# Patient Record
Sex: Male | Born: 1990 | Race: White | Hispanic: No | Marital: Single | Smoking: Former smoker
Health system: Southern US, Community
[De-identification: ages and names within clinical notes are randomized; demographics above are authoritative.]

## PROBLEM LIST (undated history)

## (undated) DIAGNOSIS — Z87442 Personal history of urinary calculi: Secondary | ICD-10-CM

## (undated) HISTORY — PX: KNEE ARTHROSCOPY: SUR90

## (undated) HISTORY — PX: EYE SURGERY: SHX253

---

## 2020-11-11 ENCOUNTER — Emergency Department
Admission: EM | Admit: 2020-11-11 | Discharge: 2020-11-11 | Disposition: A | Discharge status: Home / Self Care | Payer: Self-pay | Attending: Emergency Medicine | Admitting: Emergency Medicine

## 2020-11-11 ENCOUNTER — Encounter: Payer: Self-pay | Admitting: Emergency Medicine

## 2020-11-11 ENCOUNTER — Other Ambulatory Visit: Payer: Self-pay

## 2020-11-11 ENCOUNTER — Emergency Department: Payer: Self-pay

## 2020-11-11 DIAGNOSIS — Z87891 Personal history of nicotine dependence: Secondary | ICD-10-CM | POA: Insufficient documentation

## 2020-11-11 DIAGNOSIS — N201 Calculus of ureter: Secondary | ICD-10-CM | POA: Insufficient documentation

## 2020-11-11 HISTORY — DX: Personal history of urinary calculi: Z87.442

## 2020-11-11 LAB — URINALYSIS, ROUTINE W REFLEX MICROSCOPIC
Bilirubin Urine: NEGATIVE
Glucose, UA: NEGATIVE mg/dL
Ketones, ur: NEGATIVE mg/dL
Leukocytes,Ua: NEGATIVE
Nitrite: NEGATIVE
Protein, ur: NEGATIVE mg/dL
Specific Gravity, Urine: 1.019 (ref 1.005–1.030)
Squamous Epithelial / HPF: NONE SEEN (ref 0–5)
pH: 5 (ref 5.0–8.0)

## 2020-11-11 LAB — CBC WITH DIFFERENTIAL/PLATELET
Abs Immature Granulocytes: 0.04 10*3/uL (ref 0.00–0.07)
Basophils Absolute: 0 10*3/uL (ref 0.0–0.1)
Basophils Relative: 0 %
Eosinophils Absolute: 0.1 10*3/uL (ref 0.0–0.5)
Eosinophils Relative: 1 %
HCT: 44.4 % (ref 39.0–52.0)
Hemoglobin: 15.7 g/dL (ref 13.0–17.0)
Immature Granulocytes: 1 %
Lymphocytes Relative: 45 %
Lymphs Abs: 3.8 10*3/uL (ref 0.7–4.0)
MCH: 29 pg (ref 26.0–34.0)
MCHC: 35.4 g/dL (ref 30.0–36.0)
MCV: 81.9 fL (ref 80.0–100.0)
Monocytes Absolute: 0.6 10*3/uL (ref 0.1–1.0)
Monocytes Relative: 7 %
Neutro Abs: 3.9 10*3/uL (ref 1.7–7.7)
Neutrophils Relative %: 46 %
Platelets: 231 10*3/uL (ref 150–400)
RBC: 5.42 MIL/uL (ref 4.22–5.81)
RDW: 12.8 % (ref 11.5–15.5)
WBC: 8.5 10*3/uL (ref 4.0–10.5)
nRBC: 0 % (ref 0.0–0.2)

## 2020-11-11 LAB — COMPREHENSIVE METABOLIC PANEL
ALT: 34 U/L (ref 0–44)
AST: 22 U/L (ref 15–41)
Albumin: 4.1 g/dL (ref 3.5–5.0)
Alkaline Phosphatase: 81 U/L (ref 38–126)
Anion gap: 10 (ref 5–15)
BUN: 16 mg/dL (ref 6–20)
CO2: 26 mmol/L (ref 22–32)
Calcium: 8.9 mg/dL (ref 8.9–10.3)
Chloride: 106 mmol/L (ref 98–111)
Creatinine, Ser: 1.1 mg/dL (ref 0.61–1.24)
GFR, Estimated: >60 mL/min (ref >60)
Glucose, Bld: 144 mg/dL — ABNORMAL HIGH (ref 70–99)
Potassium: 3.5 mmol/L (ref 3.5–5.1)
Sodium: 142 mmol/L (ref 135–145)
Total Bilirubin: 0.8 mg/dL (ref 0.3–1.2)
Total Protein: 7.4 g/dL (ref 6.5–8.1)

## 2020-11-11 MED ORDER — TAMSULOSIN HCL 0.4 MG PO CAPS: ORAL_CAPSULE | ORAL | 0 refills | Status: AC

## 2020-11-11 MED ORDER — OXYCODONE-ACETAMINOPHEN 5-325 MG PO TABS: 2.0000 | ORAL_TABLET | Freq: Four times a day (QID) | ORAL | 0 refills | Status: DC | PRN

## 2020-11-11 MED ORDER — DOCUSATE SODIUM 100 MG PO CAPS: ORAL_CAPSULE | ORAL | 0 refills | Status: AC

## 2020-11-11 MED ORDER — ONDANSETRON HCL 4 MG/2ML IJ SOLN
4.0000 mg | INTRAMUSCULAR | Status: AC
  Administered 2020-11-11: 4 mg via INTRAVENOUS
  Filled 2020-11-11: qty 2

## 2020-11-11 MED ORDER — MORPHINE SULFATE (PF) 4 MG/ML IV SOLN
4.0000 mg | Freq: Once | INTRAVENOUS | Status: AC
  Administered 2020-11-11: 4 mg via INTRAVENOUS
  Filled 2020-11-11: qty 1

## 2020-11-11 MED ORDER — KETOROLAC TROMETHAMINE 30 MG/ML IJ SOLN
15.0000 mg | Freq: Once | INTRAMUSCULAR | Status: AC
  Administered 2020-11-11: 15 mg via INTRAVENOUS
  Filled 2020-11-11: qty 1

## 2020-11-11 MED ORDER — ONDANSETRON 4 MG PO TBDP: ORAL_TABLET | ORAL | 0 refills | Status: AC

## 2020-11-11 NOTE — Discharge Instructions (Signed)
You have been seen in the Emergency Department (ED) today for pain caused by kidney stones.  As we have discussed, please drink plenty of fluids.  Please make a follow up appointment with the physician(s) listed elsewhere in this documentation.  You may take pain medication as needed but ONLY as prescribed.  Please also take your prescribed Flomax daily.  We also recommend that you take over-the-counter ibuprofen regularly according to label instructions over the next 5 days.  Take it with meals to minimize stomach discomfort.  Please see your doctor as soon as possible as stones may take 1-3 weeks to pass and you may require additional care or medications.  Do not drink alcohol, drive or participate in any other potentially dangerous activities while taking opiate pain medication as it may make you sleepy. Do not take this medication with any other sedating medications, either prescription or over-the-counter. If you were prescribed Percocet or Vicodin, do not take these with acetaminophen (Tylenol) as it is already contained within these medications.   Take Percocet as needed for severe pain.  This medication is an opiate (or narcotic) pain medication and can be habit forming.  Use it as little as possible to achieve adequate pain control.  Do not use or use it with extreme caution if you have a history of opiate abuse or dependence.  If you are on a pain contract with your primary care doctor or a pain specialist, be sure to let them know you were prescribed this medication today from the [facility].  This medication is intended for your use only - do not give any to anyone else and keep it in a secure place where nobody else, especially children, have access to it.  It will also cause or worsen constipation, so you may want to consider taking an over-the-counter stool softener while you are taking this medication.  Return to the Emergency Department (ED) or call your doctor  if you have any worsening pain, fever, painful urination, are unable to urinate, or develop other symptoms that concern you. 

## 2020-11-11 NOTE — ED Triage Notes (Signed)
Patient ambulatory to triage with steady gait, without difficulty, appears uncomfortable; reports left flank pain radiating into left lower abd for few days, awoke PTA with increased pain; denies accomp symptoms; st hx kidney stones

## 2020-11-11 NOTE — ED Notes (Signed)
Pt to CT

## 2020-11-11 NOTE — ED Provider Notes (Signed)
[facility] Emergency Department Provider Note  ____________________________________________   Event Date/Time   First MD Initiated Contact with Patient 11/11/20 0103     (approximate)  I have reviewed the triage vital signs and the nursing notes.   HISTORY  Chief Complaint Flank Pain    HPI Marcus Bridges is a 30 y.o. male with no significant chronic medical issues but who has a history of a prior kidney stone and presents tonight for acute onset and severe pain in his left flank and left lower abdomen that radiates to the back.  It started about a week ago and was relatively mild.  He felt an occasional sharp pain but it was not bad.  A couple hours ago he felt it before he went to bed and then it woke him up from sleep with sharp and severe pain with a constant ache.  He had nausea but no vomiting.  He cannot find a position of comfort and nothing in particular makes his symptoms better or worse.  He denies any pain when he urinates and denies blood in the urine.  He denies fever, sore throat, chest pain, shortness of breath, and any other abdominal pain.  No recent injuries or trauma.         Past Medical History:  Diagnosis Date  . History of kidney stones     There are no problems to display for this patient.   Past Surgical History:  Procedure Laterality Date  . EYE SURGERY    . KNEE ARTHROSCOPY Left     Prior to Admission medications   Medication Sig Start Date End Date Taking? Authorizing Provider  docusate sodium (COLACE) 100 MG capsule Take 1 tablet once or twice daily as needed for constipation while taking narcotic pain medicine 11/11/20  Yes Loleta Rose, MD  ondansetron (ZOFRAN ODT) 4 MG disintegrating tablet Allow 1-2 tablets to dissolve in your mouth every 8 hours as needed for nausea/vomiting 11/11/20  Yes Loleta Rose, MD  oxyCODONE-acetaminophen (PERCOCET) 5-325 MG tablet Take 2 tablets by mouth every 6 (six) hours as needed for  severe pain. 11/11/20  Yes Loleta Rose, MD  tamsulosin (FLOMAX) 0.4 MG CAPS capsule Take 1 tablet by mouth daily until you pass the kidney stone or no longer have symptoms 11/11/20  Yes Loleta Rose, MD    Allergies Patient has no known allergies.  History reviewed. No pertinent family history.  Social History Social History   Tobacco Use  . Smoking status: Former Games developer  . Smokeless tobacco: Never Used  Vaping Use  . Vaping Use: Some days    Review of Systems Constitutional: No fever/chills Eyes: No visual changes. ENT: No sore throat. Cardiovascular: Denies chest pain. Respiratory: Denies shortness of breath. Gastrointestinal: Pain in the left lower quadrant radiating to the back with nausea but no vomiting. Genitourinary: Negative for dysuria. Musculoskeletal: Pain in the left flank. Integumentary: Negative for rash. Neurological: Negative for headaches, focal weakness or numbness.   ____________________________________________   PHYSICAL EXAM:  VITAL SIGNS: ED Triage Vitals  Enc Vitals Group     BP 11/11/20 0026 (!) 138/103     Pulse Rate 11/11/20 0026 77     Resp 11/11/20 0026 18     Temp 11/11/20 0026 98.4 F (36.9 C)     Temp Source 11/11/20 0026 Oral     SpO2 11/11/20 0026 97 %     Weight 11/11/20 0027 99.8 kg (220 lb)     Height 11/11/20 0027  1.803 m (5\' 11" )     Head Circumference --      Peak Flow --      Pain Score 11/11/20 0026 7     Pain Loc --      Pain Edu? --      Excl. in GC? --     Constitutional: Alert and oriented.  Appears quite uncomfortable. Eyes: Conjunctivae are normal.  Head: Atraumatic. Nose: No congestion/rhinnorhea. Mouth/Throat: Patient is wearing a mask. Neck: No stridor.  No meningeal signs.   Cardiovascular: Normal rate, regular rhythm. Good peripheral circulation. Respiratory: Normal respiratory effort.  No retractions. Gastrointestinal: Soft and nondistended.  Mild tenderness to palpation of the left lower  quadrant. Musculoskeletal: Left CVA tenderness to percussion.  No lower extremity tenderness nor edema. No gross deformities of extremities. Neurologic:  Normal speech and language. No gross focal neurologic deficits are appreciated.  Skin:  Skin is warm, dry and intact. Psychiatric: Mood and affect are normal. Speech and behavior are normal.  ____________________________________________   LABS (all labs ordered are listed, but only abnormal results are displayed)  Labs Reviewed  COMPREHENSIVE METABOLIC PANEL - Abnormal; Notable for the following components:      Result Value   Glucose, Bld 144 (*)    All other components within normal limits  URINALYSIS, ROUTINE W REFLEX MICROSCOPIC - Abnormal; Notable for the following components:   Color, Urine YELLOW (*)    APPearance CLEAR (*)    Hgb urine dipstick MODERATE (*)    Bacteria, UA RARE (*)    All other components within normal limits  URINE CULTURE  CBC WITH DIFFERENTIAL/PLATELET   ____________________________________________  EKG  No indication for emergent EKG ____________________________________________  RADIOLOGY I, 01/09/21, personally viewed and evaluated these images (plain radiographs) as part of my medical decision making, as well as reviewing the written report by the radiologist.  ED MD interpretation: 4 mm left mid ureteral stone.  Official radiology report(s): CT Renal Stone Study  Result Date: 11/11/2020 CLINICAL DATA:  Left flank pain with nausea, history of urolithiasis EXAM: CT ABDOMEN AND PELVIS WITHOUT CONTRAST TECHNIQUE: Multidetector CT imaging of the abdomen and pelvis was performed following the standard protocol without IV contrast. COMPARISON:  None. FINDINGS: Lower chest: Ground-glass opacity in dependent left lung base favored to be atelectatic. Lung bases otherwise clear. Normal heart size. No pericardial effusion. Hepatobiliary: No visible focal liver lesion with limitations of this unenhanced  CT. Diffuse hepatic hypoattenuation compatible with hepatic steatosis. Sparing seen along the gallbladder fossa. Gallbladder largely decompressed. No pericholecystic fluid or inflammation. No visible calcified gallstones. No biliary ductal dilatation. Pancreas: Partial fatty replacement of the pancreas. No pancreatic ductal dilatation or surrounding inflammatory changes. Spleen: Normal in size. No concerning splenic lesions. Adrenals/Urinary Tract: Normal adrenal glands. Kidneys are symmetric in size and normally located. There is asymmetric mild left hydroureteronephrosis to the level of a 4 mm calculus in the mid left ureter as it crosses the psoas musculature (5/75, 2/56). No other urolithiasis or right urinary tract dilatation. Urinary bladder is unremarkable for degree of distension. Stomach/Bowel: Distal esophagus, stomach and duodenal sweep are unremarkable. No small bowel wall thickening or dilatation. No evidence of obstruction. A normal appendix is visualized. No colonic dilatation or wall thickening. Few noninflamed distal colonic diverticula. Vascular/Lymphatic: No significant vascular findings are present. No enlarged abdominal or pelvic lymph nodes. Reproductive: The prostate and seminal vesicles are unremarkable. Other: No abdominopelvic free fluid or free gas. No bowel containing hernias. Musculoskeletal: No acute  osseous abnormality or suspicious osseous lesion. IMPRESSION: 1. Mild left hydroureteronephrosis to the level of a 4 mm calculus in the mid left ureter as it crosses the psoas musculature. 2. Hepatic steatosis. Electronically Signed   By: Kreg Shropshire M.D.   On: 11/11/2020 01:26    ____________________________________________   PROCEDURES   Procedure(s) performed (including Critical Care):  Procedures   ____________________________________________   INITIAL IMPRESSION / MDM / ASSESSMENT AND PLAN / ED COURSE  As part of my medical decision making, I reviewed the following  data within the electronic MEDICAL RECORD NUMBER Nursing notes reviewed and incorporated, Labs reviewed , Old chart reviewed, Notes from prior ED visits and [place] Controlled Substance Database   Differential diagnosis includes, but is not limited to, renal/ureteral colic, UTI/pyelonephritis, STD, diverticulitis or other acute intra-abdominal infection, renal ischemia.  Symptoms strongly suggested renal/ureteral colic and I ordered a CT renal stone protocol which demonstrated a 4 mm left mid ureteral stone.  Vital signs are essentially normal other than some hypertension which is likely contributed to by his pain.  He is afebrile.  CMP and CBC are both normal.  Doubt infection, but will check urinalysis.  Treating with morphine 4 mg IV, Toradol 15 mg IV, and Zofran 4 mg IV.  I talked with him about the possibility of lithotripsy later today even though I think it might be too late for him to get on the schedule, and he would rather try to pass it on his own since it is of a size that should pass by itself.  I will give him follow-up information with Dr. Apolinar Junes in case he is still symptomatic next week.       Clinical Course as of 11/11/20 0253  Thu Nov 11, 2020  0230 Urinalysis, Routine w reflex microscopic Urine, Clean Catch(!) Generally reassuring urinalysis with stone hemoglobinuria but no clear evidence of infection.  He does have rare bacteria present so we will send a urine culture but I strongly doubt active infection. [CF]  0240 Patient feeling much better.  Ready to go home.  Reiterated plan, follow up recommendations, and return precautions. [CF]    Clinical Course User Index [CF] Loleta Rose, MD     ____________________________________________  FINAL CLINICAL IMPRESSION(S) / ED DIAGNOSES  Final diagnoses:  Left ureteral stone     MEDICATIONS GIVEN DURING THIS VISIT:  Medications  morphine 4 MG/ML injection 4 mg (4 mg Intravenous Given 11/11/20 0146)  ketorolac (TORADOL) 30 MG/ML  injection 15 mg (15 mg Intravenous Given 11/11/20 0145)  ondansetron (ZOFRAN) injection 4 mg (4 mg Intravenous Given 11/11/20 0146)     ED Discharge Orders         Ordered    oxyCODONE-acetaminophen (PERCOCET) 5-325 MG tablet  Every 6 hours PRN        11/11/20 0253    ondansetron (ZOFRAN ODT) 4 MG disintegrating tablet        11/11/20 0253    tamsulosin (FLOMAX) 0.4 MG CAPS capsule        11/11/20 0253    docusate sodium (COLACE) 100 MG capsule        11/11/20 0253          *Please note:  Marcus Bridges was evaluated in Emergency Department on 11/11/2020 for the symptoms described in the history of present illness. He was evaluated in the context of the global COVID-19 pandemic, which necessitated consideration that the patient might be at risk for infection with the SARS-CoV-2 virus that causes  COVID-19. Institutional protocols and algorithms that pertain to the evaluation of patients at risk for COVID-19 are in a state of rapid change based on information released by regulatory bodies including the CDC and federal and state organizations. These policies and algorithms were followed during the patient's care in the ED.  Some ED evaluations and interventions may be delayed as a result of limited staffing during and after the pandemic.*  Note:  This document was prepared using Dragon voice recognition software and may include unintentional dictation errors.   Loleta Rose, MD 11/11/20 [phone]

## 2020-11-12 LAB — URINE CULTURE
Culture: <10000 — AB
Special Requests: NORMAL

## 2022-02-12 IMAGING — CT CT RENAL STONE PROTOCOL
2 of 4 series · 16 of 46 positions shown, 18 images · non-contrast
Comparison: None.

CLINICAL DATA: Left flank pain with nausea, history of urolithiasis

EXAM:
CT ABDOMEN AND PELVIS WITHOUT CONTRAST
TECHNIQUE: Multidetector CT imaging of the abdomen and pelvis was performed
following the standard protocol without IV contrast.

[Series 2: stone full standard · axial · 0.88mm/px · z∈[-269,+221]mm · 13 of 110 slices shown, 15 images]
[im 6/110  soft-tissue]
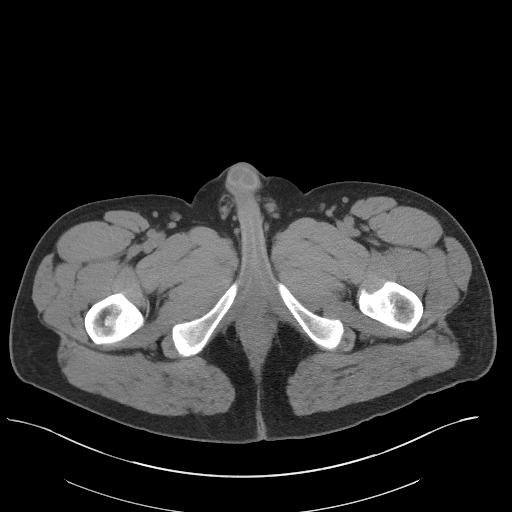
[im 6/110  bone]
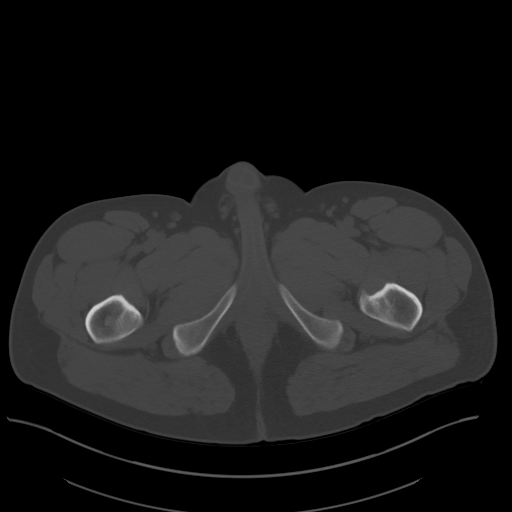
[im 16/110  soft-tissue]
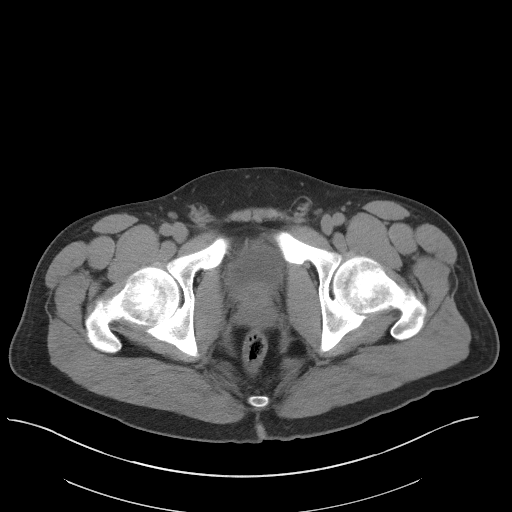
[im 21/110  soft-tissue]
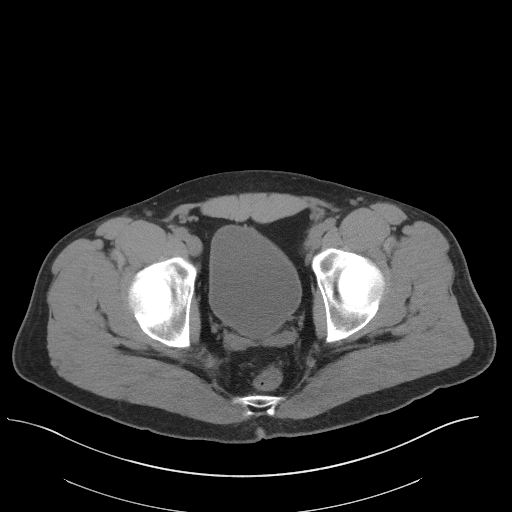
[im 32/110  soft-tissue]
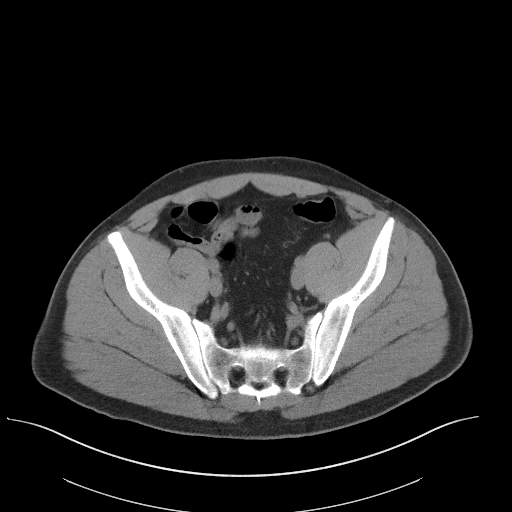
[im 37/110  soft-tissue]
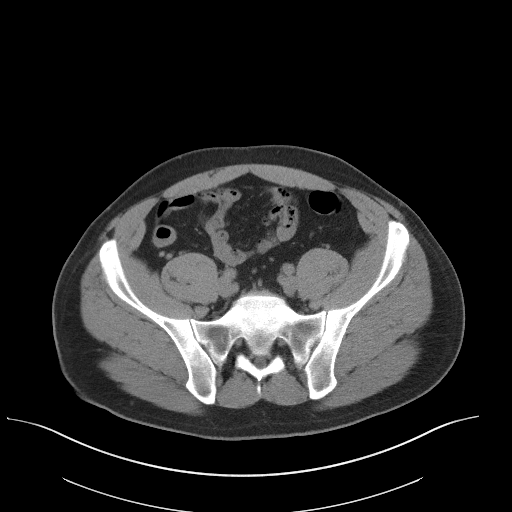
[im 47/110  soft-tissue]
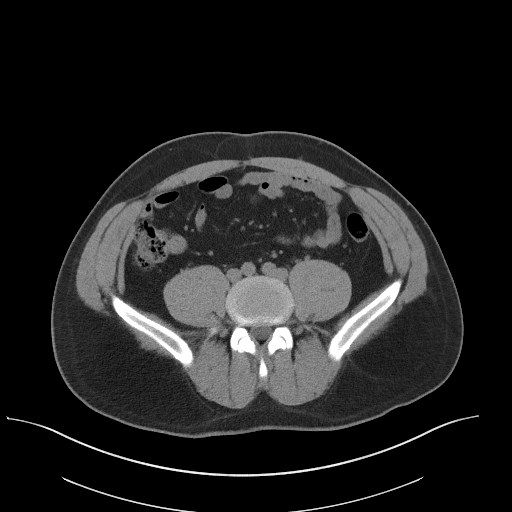
[im 58/110  soft-tissue]
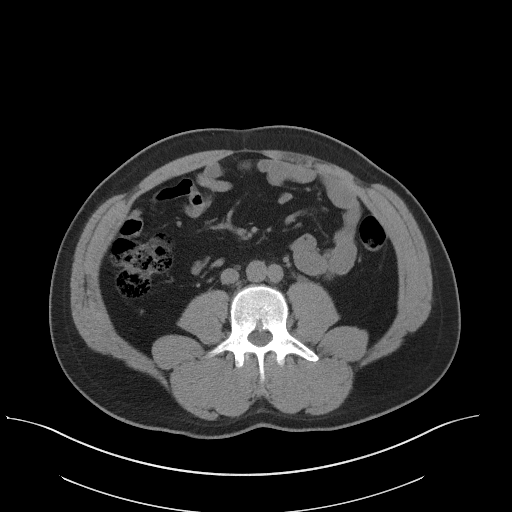
[im 63/110  soft-tissue]
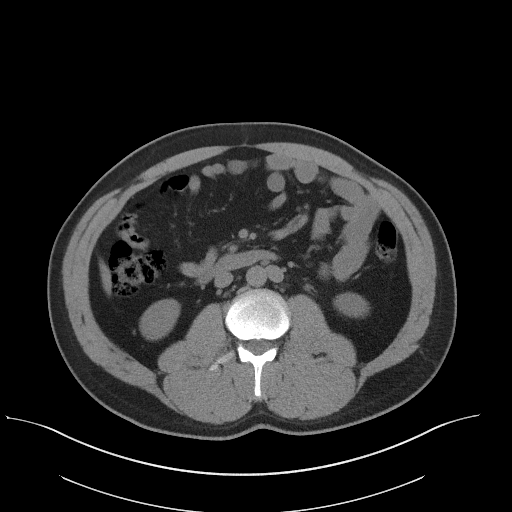
[im 73/110  soft-tissue]
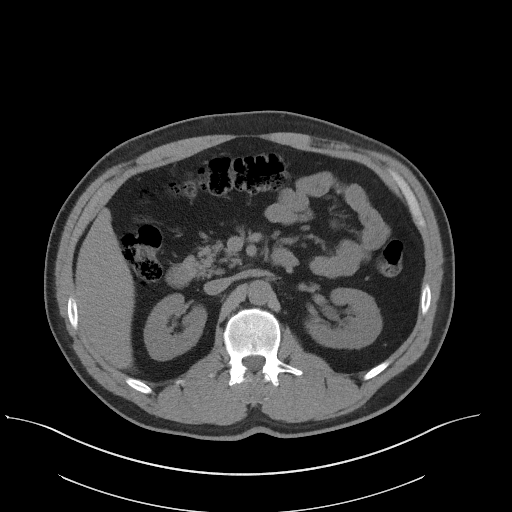
[im 73/110  bone]
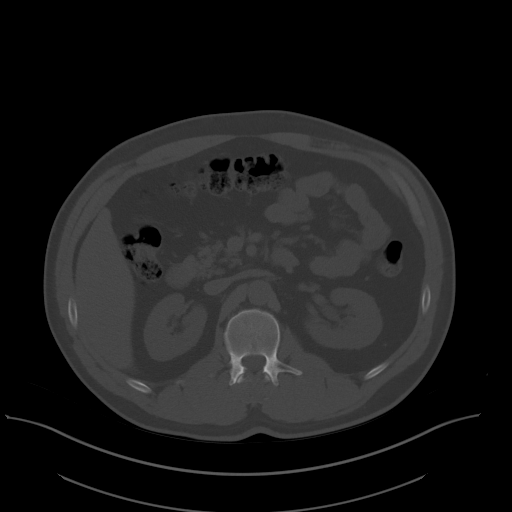
[im 78/110  soft-tissue]
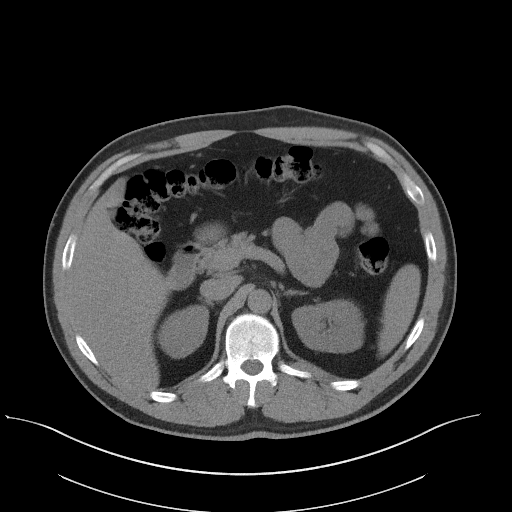
[im 89/110  soft-tissue]
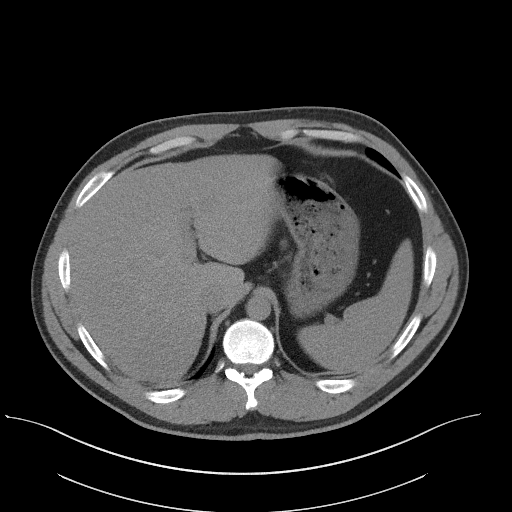
[im 94/110  soft-tissue]
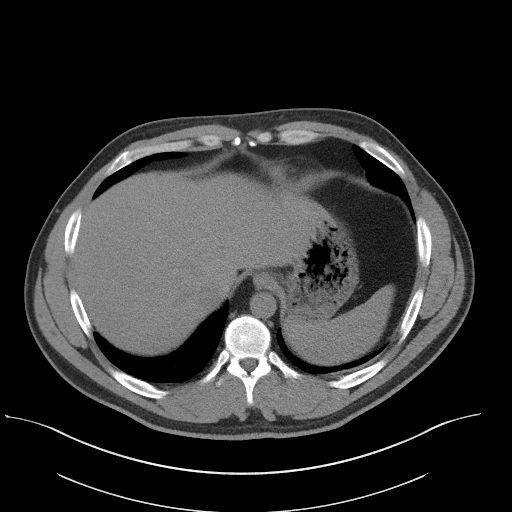
[im 104/110  soft-tissue]
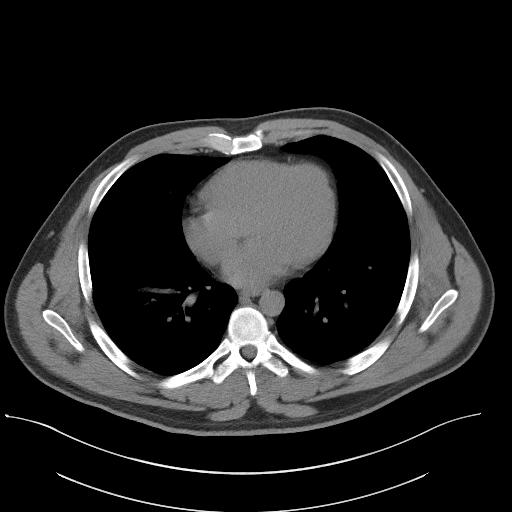

[Series 5: coronal · coronal · 0.87mm/px · 3 of 149 slices shown]
[im 50/149  soft-tissue]
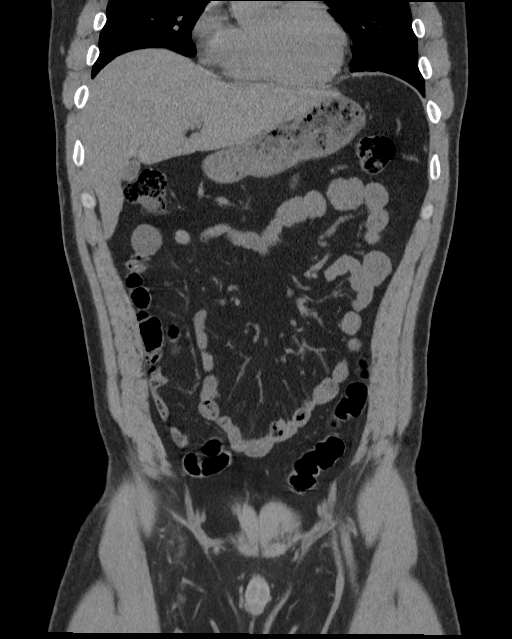
[im 66/149  soft-tissue]
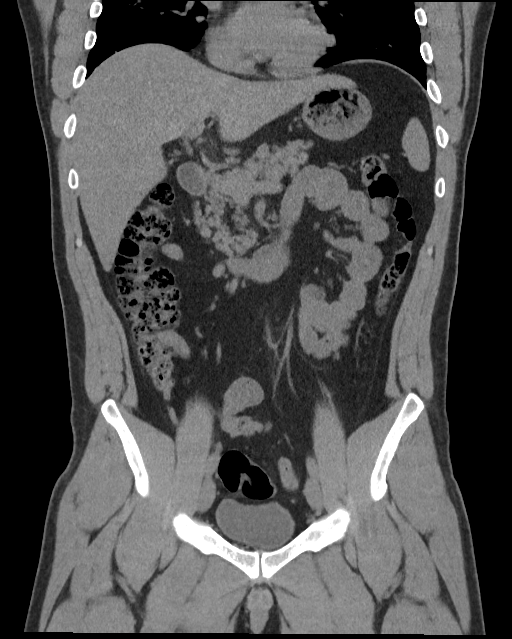
[im 83/149  soft-tissue]
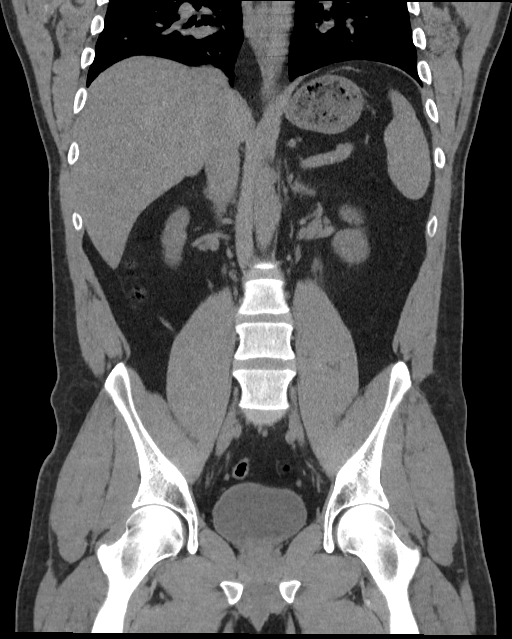

[16 of 46 positions shown; findings below may reference images not displayed]

FINDINGS: Lower chest: Ground-glass opacity in dependent left lung base
favored to be atelectatic. Lung bases otherwise clear. Normal heart
size. No pericardial effusion.

Hepatobiliary: No visible focal liver lesion with limitations of
this unenhanced CT. Diffuse hepatic hypoattenuation compatible with
hepatic steatosis. Sparing seen along the gallbladder fossa.
Gallbladder largely decompressed. No pericholecystic fluid or
inflammation. No visible calcified gallstones. No biliary ductal
dilatation.

Pancreas: Partial fatty replacement of the pancreas. No pancreatic
ductal dilatation or surrounding inflammatory changes.

Spleen: Normal in size. No concerning splenic lesions.

Adrenals/Urinary Tract: Normal adrenal glands. Kidneys are symmetric
in size and normally located. There is asymmetric mild left
hydroureteronephrosis to the level of a 4 mm calculus in the mid
left ureter as it crosses the psoas musculature (5/75, 2/56). No
other urolithiasis or right urinary tract dilatation. Urinary
bladder is unremarkable for degree of distension.

Stomach/Bowel: Distal esophagus, stomach and duodenal sweep are
unremarkable. No small bowel wall thickening or dilatation. No
evidence of obstruction. A normal appendix is visualized. No colonic
dilatation or wall thickening. Few noninflamed distal colonic
diverticula.

Vascular/Lymphatic: No significant vascular findings are present. No
enlarged abdominal or pelvic lymph nodes.

Reproductive: The prostate and seminal vesicles are unremarkable.

Other: No abdominopelvic free fluid or free gas. No bowel containing
hernias.

Musculoskeletal: No acute osseous abnormality or suspicious osseous
lesion.
IMPRESSION: 1. Mild left hydroureteronephrosis to the level of a 4 mm calculus
in the mid left ureter as it crosses the psoas musculature.
2. Hepatic steatosis.

## 2024-05-21 ENCOUNTER — Emergency Department: Payer: Self-pay

## 2024-05-21 ENCOUNTER — Other Ambulatory Visit: Payer: Self-pay

## 2024-05-21 ENCOUNTER — Emergency Department
Admission: EM | Admit: 2024-05-21 | Discharge: 2024-05-21 | Disposition: A | Discharge status: Home / Self Care | Payer: Self-pay | Attending: Emergency Medicine | Admitting: Emergency Medicine

## 2024-05-21 DIAGNOSIS — L237 Allergic contact dermatitis due to plants, except food: Secondary | ICD-10-CM

## 2024-05-21 MED ORDER — PREDNISONE 10 MG PO TABS
ORAL_TABLET | ORAL | 0 refills | Status: AC
Start: 2024-05-21 — End: 2024-06-10

## 2024-05-21 MED ORDER — DOXYCYCLINE MONOHYDRATE 100 MG PO TABS: 100.0000 mg | ORAL_TABLET | Freq: Two times a day (BID) | ORAL | 0 refills | Status: AC

## 2024-05-21 NOTE — ED Provider Triage Note (Signed)
 Emergency Medicine Provider Triage Evaluation Note  Marcus Bridges , a 33 y.o. male  was evaluated in triage.  Pt complains of burning rash on lower extremities and abdomen after poison ivy/oak exposure 4 days ago. Left lower extremity burns and is swollen. Fever yesterday up to 101.2  Physical Exam  BP 127/79 (BP Location: Left Arm)   Pulse 97   Temp 98.1 F (36.7 C) (Oral)   Resp 16   Wt 99.8 kg   SpO2 99%   BMI 30.69 kg/m  Gen:   Awake, no distress   Resp:  Normal effort  MSK:   Moves extremities without difficulty  Other:  Vesicular rash to lower extremities and abdomen, left greater than right. Left lower extremity slightly swollen and questionable erythema (topical medication is also pink/red per patient.)  Medical Decision Making  Medically screening exam initiated at 2:08 PM.  Appropriate orders placed.  Mordche Hedglin was informed that the remainder of the evaluation will be completed by another provider, this initial triage assessment does not replace that evaluation, and the importance of remaining in the ED until their evaluation is complete.    Herlinda Kirk NOVAK, FNP 05/21/24 1516

## 2024-05-21 NOTE — ED Triage Notes (Signed)
 C/o reaction to poison ivy/oak since Saturday.  Place Marcus Bridges from [facility] to rash, no relief.  Also runny fever 101.2 x 2 days.,  AAOx3. Skin warm and dry. NAD

## 2024-05-21 NOTE — Group Note (Deleted)
 Date:  05/21/2024 Time:  2:21 PM  Group Topic/Focus:  Wellness Toolbox:   The focus of this group is to discuss various aspects of wellness, balancing those aspects and exploring ways to increase the ability to experience wellness.  Patients will create a wellness toolbox for use upon discharge.     Participation Level:  {BHH PARTICIPATION OZCZO:77735}  Participation Quality:  {BHH PARTICIPATION QUALITY:22265}  Affect:  {BHH AFFECT:22266}  Cognitive:  {BHH COGNITIVE:22267}  Insight: {BHH Insight2:20797}  Engagement in Group:  {BHH ENGAGEMENT IN HMNLE:77731}  Modes of Intervention:  {BHH MODES OF INTERVENTION:22269}  Additional Comments:  ***  Myra Curtistine BROCKS 05/21/2024, 2:21 PM

## 2024-05-21 NOTE — ED Provider Notes (Addendum)
 [facility] Provider Note    Event Date/Time   First MD Initiated Contact with Patient 05/21/24 1553     (approximate)   History   No chief complaint on file.   HPI  Marcus Bridges is a 33 y.o. male no significant past medical history presents to the emergency department with concern for infection or allergic reaction.  States that he was working outside at his house and thought he was just pulling out some fine but he is concerned that he is having poison ivy.  Endorses a severe rash to his legs, trunk, arms.  Had a fever for the past 2 days and some increased work of breathing.  Did not have a fever today.  No chest pain.  Denies significant cough.  No abdominal pain nausea or vomiting.  Denies any tobacco use.     Physical Exam   Triage Vital Signs: ED Triage Vitals  Encounter Vitals Group     BP 05/21/24 1405 127/79     Girls Systolic BP Percentile --      Girls Diastolic BP Percentile --      Boys Systolic BP Percentile --      Boys Diastolic BP Percentile --      Pulse Rate 05/21/24 1405 97     Resp 05/21/24 1405 16     Temp 05/21/24 1405 98.1 F (36.7 C)     Temp Source 05/21/24 1405 Oral     SpO2 05/21/24 1405 99 %     Weight 05/21/24 1404 220 lb 0.3 oz (99.8 kg)     Height --      Head Circumference --      Peak Flow --      Pain Score 05/21/24 1404 0     Pain Loc --      Pain Education --      Exclude from Growth Chart --     Most recent vital signs: Vitals:   05/21/24 1405 05/21/24 1725  BP: 127/79 137/85  Pulse: 97 74  Resp: 16 16  Temp: 98.1 F (36.7 C) 98.1 F (36.7 C)  SpO2: 99% 99%    Physical Exam Constitutional:      Appearance: He is well-developed.  HENT:     Head: Atraumatic.  Eyes:     Conjunctiva/sclera: Conjunctivae normal.  Cardiovascular:     Rate and Rhythm: Regular rhythm.  Pulmonary:     Effort: Pulmonary effort is normal. No respiratory distress.     Breath sounds: No wheezing.   Musculoskeletal:     Cervical back: Normal range of motion.  Skin:    General: Skin is warm.     Findings: Rash present.     Comments: Diffuse vesicular rash to his left lower leg, right lower leg, his trunk.  No significant surrounding erythema or warmth.  No induration or crepitance.  Neurological:     Mental Status: He is alert. Mental status is at baseline.      IMPRESSION / MDM / ASSESSMENT AND PLAN / ED COURSE  I reviewed the triage vital signs and the nursing notes.  Differential diagnosis including contact dermatitis, allergic reaction, pneumonia, viral illness   RADIOLOGY I independently reviewed imaging, my interpretation of imaging: Chest x-ray with no obvious pneumonia   Labs (all labs ordered are listed, but only abnormal results are displayed) Labs interpreted as -    Labs Reviewed - No data to display    Patient most likely with contact dermatitis  from poison ivy or poison oak.  Given that it is diffuse to his entire body will do oral steroids.  Otherwise is well-appearing.  Have a low suspicion for superimposed infection.  Chest x-ray on my evaluation without an obvious pneumonia.  It has not been read but patient stated that he cannot stay any longer and needs to go home to watch his children.  States that he would return if his shortness of breath got worse.  Will do a prolonged course of steroids.  Discussed symptomatic treatment.  Discussed follow-up with primary care physician.  Chest x-ray with bilateral interstitial opacities likely reflecting karate doing too low lung volumes versus atypical infection versus interstitial edema.  Patient did not have any obvious findings of volume overload and have a low suspicion for pulmonary edema.  Given his recent fever we will go ahead and start the patient on antibiotic, I called him in doxycycline  and called to let him know that he needs to return to the emergency department if he has any worsening shortness of breath  or if his shortness of breath does not improved after a couple of days of antibiotics and steroids.  Patient expressed understanding and stated he would return if the shortness of breath returned/worsened   PROCEDURES:  Critical Care performed: No  Procedures  Patient's presentation is most consistent with acute complicated illness / injury requiring diagnostic workup.   MEDICATIONS ORDERED IN ED: Medications - No data to display  FINAL CLINICAL IMPRESSION(S) / ED DIAGNOSES   Final diagnoses:  Poison ivy dermatitis     Rx / DC Orders   ED Discharge Orders          Ordered    Ambulatory Referral to Primary Care (Establish Care)        05/21/24 1648    predniSONE  (DELTASONE ) 10 MG tablet  Q breakfast        05/21/24 1654    doxycycline  (ADOXA) 100 MG tablet  2 times daily        05/21/24 1833             Note:  This document was prepared using Dragon voice recognition software and may include unintentional dictation errors.   Suzanne Kirsch, MD 05/21/24 ARDELLA    Suzanne Kirsch, MD 05/21/24 [phone]
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
## 2024-06-23 ENCOUNTER — Emergency Department
Admission: EM | Admit: 2024-06-23 | Discharge: 2024-06-23 | Disposition: A | Discharge status: Home / Self Care | Payer: Self-pay | Attending: Emergency Medicine | Admitting: Emergency Medicine

## 2024-06-23 ENCOUNTER — Other Ambulatory Visit: Payer: Self-pay

## 2024-06-23 ENCOUNTER — Emergency Department: Payer: Self-pay

## 2024-06-23 ENCOUNTER — Encounter: Payer: Self-pay | Admitting: Emergency Medicine

## 2024-06-23 DIAGNOSIS — S92322A Displaced fracture of second metatarsal bone, left foot, initial encounter for closed fracture: Secondary | ICD-10-CM | POA: Insufficient documentation

## 2024-06-23 DIAGNOSIS — Y92410 Unspecified street and highway as the place of occurrence of the external cause: Secondary | ICD-10-CM | POA: Insufficient documentation

## 2024-06-23 DIAGNOSIS — S92902A Unspecified fracture of left foot, initial encounter for closed fracture: Secondary | ICD-10-CM

## 2024-06-23 MED ORDER — ACETAMINOPHEN 500 MG PO TABS
1000.0000 mg | ORAL_TABLET | Freq: Once | ORAL | Status: AC
  Administered 2024-06-23: 1000 mg via ORAL
  Filled 2024-06-23: qty 2

## 2024-06-23 MED ORDER — TRAMADOL HCL 50 MG PO TABS: 50.0000 mg | ORAL_TABLET | Freq: Four times a day (QID) | ORAL | 0 refills | Status: AC | PRN

## 2024-06-23 NOTE — ED Provider Notes (Signed)
 Patient does not want to wait any longer for CT foot read, will place the patient in cam boot, crutches, close follow-up with podiatry, he understands that he needs to see them ASAP   Arlander [place], MD 06/23/24 [phone]
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
## 2024-06-23 NOTE — ED Notes (Signed)
 Patient refused Cts currently stating he wanted to speak with doctor before having scans.
